# Patient Record
Sex: Male | Born: 1975 | Race: Black or African American | Hispanic: No | Marital: Married | Smoking: Never smoker
Health system: Southern US, Community
[De-identification: ages and names within clinical notes are randomized; demographics above are authoritative.]

---

## 2018-09-23 ENCOUNTER — Emergency Department (HOSPITAL_COMMUNITY): Payer: Federal, State, Local not specified - PPO

## 2018-09-23 ENCOUNTER — Encounter (HOSPITAL_COMMUNITY): Payer: Self-pay | Admitting: *Deleted

## 2018-09-23 ENCOUNTER — Other Ambulatory Visit: Payer: Self-pay

## 2018-09-23 ENCOUNTER — Observation Stay (HOSPITAL_COMMUNITY): Payer: Federal, State, Local not specified - PPO

## 2018-09-23 ENCOUNTER — Observation Stay (HOSPITAL_COMMUNITY)
Admission: EM | Admit: 2018-09-23 | Discharge: 2018-09-24 | Disposition: A | Discharge status: Home / Self Care | Payer: Federal, State, Local not specified - PPO | Attending: Internal Medicine | Admitting: Internal Medicine

## 2018-09-23 DIAGNOSIS — F4310 Post-traumatic stress disorder, unspecified: Secondary | ICD-10-CM | POA: Insufficient documentation

## 2018-09-23 DIAGNOSIS — R079 Chest pain, unspecified: Secondary | ICD-10-CM | POA: Insufficient documentation

## 2018-09-23 DIAGNOSIS — I119 Hypertensive heart disease without heart failure: Secondary | ICD-10-CM | POA: Insufficient documentation

## 2018-09-23 DIAGNOSIS — I429 Cardiomyopathy, unspecified: Secondary | ICD-10-CM | POA: Insufficient documentation

## 2018-09-23 DIAGNOSIS — I10 Essential (primary) hypertension: Secondary | ICD-10-CM | POA: Diagnosis not present

## 2018-09-23 DIAGNOSIS — Z79899 Other long term (current) drug therapy: Secondary | ICD-10-CM | POA: Insufficient documentation

## 2018-09-23 DIAGNOSIS — E860 Dehydration: Secondary | ICD-10-CM | POA: Diagnosis not present

## 2018-09-23 DIAGNOSIS — I959 Hypotension, unspecified: Secondary | ICD-10-CM | POA: Insufficient documentation

## 2018-09-23 DIAGNOSIS — T671XXA Heat syncope, initial encounter: Secondary | ICD-10-CM

## 2018-09-23 DIAGNOSIS — N179 Acute kidney failure, unspecified: Secondary | ICD-10-CM | POA: Diagnosis not present

## 2018-09-23 DIAGNOSIS — Z1159 Encounter for screening for other viral diseases: Secondary | ICD-10-CM | POA: Insufficient documentation

## 2018-09-23 DIAGNOSIS — I5032 Chronic diastolic (congestive) heart failure: Secondary | ICD-10-CM | POA: Diagnosis not present

## 2018-09-23 DIAGNOSIS — S8001XA Contusion of right knee, initial encounter: Secondary | ICD-10-CM

## 2018-09-23 DIAGNOSIS — T675XXA Heat exhaustion, unspecified, initial encounter: Secondary | ICD-10-CM

## 2018-09-23 LAB — URINALYSIS, ROUTINE W REFLEX MICROSCOPIC
Bilirubin Urine: NEGATIVE
Glucose, UA: NEGATIVE mg/dL
Hgb urine dipstick: NEGATIVE
Ketones, ur: NEGATIVE mg/dL
Leukocytes,Ua: NEGATIVE
Nitrite: NEGATIVE
Protein, ur: NEGATIVE mg/dL
Specific Gravity, Urine: 1.012 (ref 1.005–1.030)
pH: 5 (ref 5.0–8.0)

## 2018-09-23 LAB — BASIC METABOLIC PANEL
Anion gap: 14 (ref 5–15)
BUN: 18 mg/dL (ref 6–20)
CO2: 22 mmol/L (ref 22–32)
Calcium: 10.8 mg/dL — ABNORMAL HIGH (ref 8.9–10.3)
Chloride: 104 mmol/L (ref 98–111)
Creatinine, Ser: 2.49 mg/dL — ABNORMAL HIGH (ref 0.61–1.24)
GFR calc Af Amer: 35 mL/min — ABNORMAL LOW (ref >60)
GFR calc non Af Amer: 30 mL/min — ABNORMAL LOW (ref >60)
Glucose, Bld: 154 mg/dL — ABNORMAL HIGH (ref 70–99)
Potassium: 3.6 mmol/L (ref 3.5–5.1)
Sodium: 140 mmol/L (ref 135–145)

## 2018-09-23 LAB — CK: Total CK: 480 U/L — ABNORMAL HIGH (ref 49–397)

## 2018-09-23 LAB — CBC
HCT: 50.3 % (ref 39.0–52.0)
Hemoglobin: 16.9 g/dL (ref 13.0–17.0)
MCH: 29.2 pg (ref 26.0–34.0)
MCHC: 33.6 g/dL (ref 30.0–36.0)
MCV: 86.9 fL (ref 80.0–100.0)
Platelets: 317 10*3/uL (ref 150–400)
RBC: 5.79 MIL/uL (ref 4.22–5.81)
RDW: 13.1 % (ref 11.5–15.5)
WBC: 12.3 10*3/uL — ABNORMAL HIGH (ref 4.0–10.5)
nRBC: 0 % (ref 0.0–0.2)

## 2018-09-23 LAB — MAGNESIUM: Magnesium: 2.1 mg/dL (ref 1.7–2.4)

## 2018-09-23 LAB — CBG MONITORING, ED: Glucose-Capillary: 106 mg/dL — ABNORMAL HIGH (ref 70–99)

## 2018-09-23 LAB — TROPONIN I (HIGH SENSITIVITY)
Troponin I (High Sensitivity): 7 ng/L (ref <18)
Troponin I (High Sensitivity): 8 ng/L (ref <18)

## 2018-09-23 MED ORDER — MAGNESIUM SULFATE 2 GM/50ML IV SOLN
2.0000 g | Freq: Once | INTRAVENOUS | Status: AC
  Administered 2018-09-24: 2 g via INTRAVENOUS
  Filled 2018-09-23: qty 50

## 2018-09-23 MED ORDER — SODIUM CHLORIDE 0.9 % IV BOLUS
1000.0000 mL | Freq: Once | INTRAVENOUS | Status: AC
  Administered 2018-09-23: 1000 mL via INTRAVENOUS

## 2018-09-23 MED ORDER — SODIUM CHLORIDE 0.9 % IV SOLN
INTRAVENOUS | Status: DC
  Administered 2018-09-23 – 2018-09-24 (×3): via INTRAVENOUS

## 2018-09-23 MED ORDER — SODIUM CHLORIDE 0.9 % IV BOLUS: 1000.0000 mL | Freq: Once | INTRAVENOUS | Status: DC

## 2018-09-23 MED ORDER — SODIUM CHLORIDE 0.9% FLUSH: 3.0000 mL | Freq: Once | INTRAVENOUS | Status: DC

## 2018-09-23 MED ORDER — SODIUM CHLORIDE 0.9 % IV SOLN: INTRAVENOUS | Status: DC

## 2018-09-23 MED ORDER — ATORVASTATIN CALCIUM 10 MG PO TABS
20.0000 mg | ORAL_TABLET | Freq: Every day | ORAL | Status: DC
  Administered 2018-09-24: 20 mg via ORAL
  Filled 2018-09-23: qty 2

## 2018-09-23 MED ORDER — ACETAMINOPHEN 325 MG PO TABS: 650.0000 mg | ORAL_TABLET | Freq: Four times a day (QID) | ORAL | Status: DC | PRN

## 2018-09-23 MED ORDER — ACETAMINOPHEN 650 MG RE SUPP: 650.0000 mg | Freq: Four times a day (QID) | RECTAL | Status: DC | PRN

## 2018-09-23 NOTE — ED Provider Notes (Signed)
[facility] EMERGENCY [facility]PARTMENT Provider Note   CSN: [id number] Arrival date & time: 09/23/18  1227    History   Chief Complaint Chief Complaint  Patient presents with  . Chest Pain  . Near Syncope  . Knee Pain    HPI Tyler SomanJames N Zapanta Jr. is a 43 y.o. male.     Pt presents to the ED today with low blood pressure and syncope.  Pt is here visiting family and was working in his grandmother's yard.  It is very hot outside and he had not been drinking.  The pt said he felt weak and felt like he was going to pass out.  He went to sit down, but ended up falling forward on his right knee.  The pt did have some cp as well.  Pt denies f/c.  No sob.  EMS was called to the home and his initial bp was in the 70s.  He did not want to go by ambulance, so he was driven by a family member.  Initial bp here was in the 80s.  The pt has a hx of htn and took his bp meds before he worked outside.     History reviewed. No pertinent past medical history.  There are no active problems to display for this patient.   History reviewed. No pertinent surgical history.      Home Medications    Prior to Admission medications   Medication Sig Start Date End Date Taking? Authorizing Provider  amLODIPine-Valsartan-HCTZ (EXFORGE HCT) 10-320-25 MG TABS Take 1 tablet by mouth daily.   Yes [provider]  atorvastatin (LIPITOR) 20 MG tablet Take 20 mg by mouth daily.   Yes [provider]  metoprolol succinate (TOPROL-XL) 25 MG 24 hr tablet Take 25 mg by mouth daily.    Yes [provider]    Family History No family history on file.  Social History Social History   Tobacco Use  . Smoking status: Not on file  Substance Use Topics  . Alcohol use: Not on file  . Drug use: Not on file     Allergies   Patient has no known allergies.   Review of Systems Review of Systems  Cardiovascular: Positive for chest pain.  Musculoskeletal:       Right knee  pain  Neurological: Positive for syncope.  All other systems reviewed and are negative.    Physical Exam Updated Vital Signs BP (!) 92/49   Pulse 77   Temp 97.8 F (36.6 C) (Oral)   Resp 13   Ht 6\' 2"  (1.88 m)   Wt 131.5 kg   SpO2 99%   BMI 37.23 kg/m   Physical Exam Vitals signs and nursing note reviewed.  Constitutional:      General: He is in acute distress.     Appearance: He is well-developed. He is diaphoretic.  HENT:     Head: Normocephalic and atraumatic.  Eyes:     Extraocular Movements: Extraocular movements intact.     Pupils: Pupils are equal, round, and reactive to light.  Neck:     Musculoskeletal: Normal range of motion and neck supple.  Cardiovascular:     Rate and Rhythm: Normal rate and regular rhythm.     Heart sounds: Normal heart sounds.  Pulmonary:     Effort: Pulmonary effort is normal.     Breath sounds: Normal breath sounds.  Abdominal:     General: Bowel sounds are normal.  Palpations: Abdomen is soft.  Musculoskeletal:     Right knee: Tenderness found.  Skin:    General: Skin is warm.     Capillary Refill: Capillary refill takes less than 2 seconds.  Neurological:     General: No focal deficit present.     Mental Status: He is alert and oriented to person, place, and time.  Psychiatric:        Mood and Affect: Mood normal.        Behavior: Behavior normal.      ED Treatments / Results  Labs (all labs ordered are listed, but only abnormal results are displayed) Labs Reviewed  BASIC METABOLIC PANEL - Abnormal; Notable for the following components:      Result Value   Glucose, Bld 154 (*)    Creatinine, Ser 2.49 (*)    Calcium 10.8 (*)    GFR calc non Af Amer 30 (*)    GFR calc Af Amer 35 (*)    All other components within normal limits  CBC - Abnormal; Notable for the following components:   WBC 12.3 (*)    All other components within normal limits  CBG MONITORING, ED - Abnormal; Notable for the following components:    Glucose-Capillary 106 (*)    All other components within normal limits  NOVEL CORONAVIRUS, NAA (HOSPITAL ORDER, SEND-OUT TO REF LAB)  MAGNESIUM  URINALYSIS, ROUTINE W REFLEX MICROSCOPIC  CK  CBG MONITORING, ED  TROPONIN I (HIGH SENSITIVITY)  TROPONIN I (HIGH SENSITIVITY)    EKG EKG Interpretation  Date/Time:  Friday September 23 2018 12:36:11 EDT Ventricular Rate:  113 PR Interval:  124 QRS Duration: 82 QT Interval:  316 QTC Calculation: 433 R Axis:   74 Text Interpretation:  Sinus tachycardia Minimal voltage criteria for LVH, may be normal variant Abnormal QRS-T angle, consider primary T wave abnormality Abnormal ECG No old tracing to compare Confirmed by Jacalyn LefevreHaviland, Juelz Whittenberg [phone]) on 09/23/2018 12:52:01 PM Also confirmed by Jacalyn LefevreHaviland, Aliesha Dolata [phone]), editor Elita QuickWatlington, Beverly ([id number])  on 09/23/2018 2:13:47 PM   Radiology Dg Chest 2 View  Result Date: 09/23/2018 CLINICAL DATA:  Chest pain EXAM: CHEST - 2 VIEW COMPARISON:  None. FINDINGS: The heart size and mediastinal contours are within normal limits. Both lungs are clear. The visualized skeletal structures are unremarkable. IMPRESSION: No active cardiopulmonary disease. Electronically Signed   By: Sherian ReinWei-Chen  Lin M.D.   On: 09/23/2018 13:29   Dg Knee Complete 4 Views Right  Result Date: 09/23/2018 CLINICAL DATA:  Status post fall with right knee pain. EXAM: RIGHT KNEE - COMPLETE 4+ VIEW COMPARISON:  None. FINDINGS: No evidence of fracture, dislocation, or joint effusion. No evidence of arthropathy or other focal bone abnormality. Soft tissues are unremarkable. IMPRESSION: Negative. Electronically Signed   By: Sherian ReinWei-Chen  Lin M.D.   On: 09/23/2018 13:29    Procedures Procedures (including critical care time)  Medications Ordered in ED Medications  sodium chloride flush (NS) 0.9 % injection 3 mL (has no administration in time range)  sodium chloride 0.9 % bolus 1,000 mL (1,000 mLs Intravenous New Bag/Given 09/23/18 1335)    And  0.9 %   sodium chloride infusion (has no administration in time range)  sodium chloride 0.9 % bolus 1,000 mL (has no administration in time range)  sodium chloride 0.9 % bolus 1,000 mL (has no administration in time range)  sodium chloride 0.9 % bolus 1,000 mL (1,000 mLs Intravenous New Bag/Given 09/23/18 1431)     Initial Impression / Assessment and  Plan / ED Course  I have reviewed the triage vital signs and the nursing notes.  Pertinent labs & imaging results that were available during my care of the patient were reviewed by me and considered in my medical decision making (see chart for details).      Pt given IVFs and bp has improved.  EKG showed sinus tachy and initial troponin is negative.  The pt's syncope is likely from heat and sweating, but I added on a CT chest to check for aortic dissection.  He can't have contrast due to elevated cr.  He has never had kidney abnormalities in the past.  Knee xray nl.  CXR clear.  Pt's labs reviewed.    Pt's covid is pending.  Pt d/w Dr. Jamse Arn ([facility]) who will admit.  She requested CK (ordered) and additional IVFs.    CRITICAL CARE Performed by: Isla Pence   Total critical care time: 30 minutes  Critical care time was exclusive of separately billable procedures and treating other patients.  Critical care was necessary to treat or prevent imminent or life-threatening deterioration.  Critical care was time spent personally by me on the following activities: development of treatment plan with patient and/or surrogate as well as nursing, discussions with consultants, evaluation of patient's response to treatment, examination of patient, obtaining history from patient or surrogate, ordering and performing treatments and interventions, ordering and review of laboratory studies, ordering and review of radiographic studies, pulse oximetry and re-evaluation of patient's condition.  Tyler Roy. was evaluated in Emergency Department on  09/23/2018 for the symptoms described in the history of present illness. He was evaluated in the context of the global COVID-19 pandemic, which necessitated consideration that the patient might be at risk for infection with the SARS-CoV-2 virus that causes COVID-19. Institutional protocols and algorithms that pertain to the evaluation of patients at risk for COVID-19 are in a state of rapid change based on information released by regulatory bodies including the CDC and federal and state organizations. These policies and algorithms were followed during the patient's care in the ED.  Final Clinical Impressions(s) / ED Diagnoses   Final diagnoses:  Heat exhaustion, initial encounter  AKI (acute kidney injury) ([place])  Heat syncope, initial encounter  Contusion of right knee, initial encounter    ED Discharge Orders    None       Isla Pence, MD 09/23/18 [phone]

## 2018-09-23 NOTE — ED Triage Notes (Addendum)
To ED for eval of syncopal episode, cp, and right knee pain. Pt states he was working in his mom's yard when he became very weak, began feel tightness in chest, neck pain, and left arm pain - pt then dropped to knees and passed out. Multiple episodes of vomiting. EMS came to scene - did ecg. Pt wanted to come pov. Told his bp was 70/p via ems. No pain in chest now - complains of right knee pain. Skin w/d, resp e/u. Pt states he has never felt this before.   At end of triage - pt became nauseated and began belching. States he felt very dizzy and weak. Asking for water. Pt moved to a room in ED.

## 2018-09-23 NOTE — ED Notes (Signed)
Attempted to complete orthostatic VS but unable to do so due to hypotension. RN aware.

## 2018-09-23 NOTE — Progress Notes (Signed)
NEW ADMISSION NOTE New Admission Note:   Arrival Method: stretcher from ED  Mental Orientation: axox4 Telemetry: box7 Assessment: Completed Skin: see assessment  PF:[id number] AC  Pain: denies Tubes: none  Safety Measures: Safety Fall Prevention Plan has been discussed  Admission: to be completed  [facility] [place] Orientation: Patient has been orientated to the room, unit and staff.  Family: none   Orders have been reviewed and implemented. Will continue to monitor the patient. Call light has been placed within reach and bed alarm has been activated.   Paulla Fore, RN

## 2018-09-23 NOTE — H&P (Signed)
History and Physical:    Tyler SomanJames N Trammel Jr.   ZOX:[id number] DOB: [birth year] DOA: 09/23/2018  Referring MD/provider: Dr. Particia NearingHaviland PCP: Patient, No Pcp Per   Patient coming from: Home  Chief Complaint: Weakness dizziness backache  History of Present Illness:   Tyler SomanJames N Bonifield Jr. is an 43 y.o. male with past medical history significant for hypertension since age 89, grade 2 diastolic dysfunction and PTSD who was in his usual state of good health until earlier today when he developed profound weakness and fatigue while working in the yard doing physical labor.  Patient states that about an hour and a half after working in the sun patient developed cramping in his neck his back his left chest with radiation down his left arm.  Patient states that he went and got some water and then vomited and after the vomitus his chest pain resolved although he continued to have back and neck pain.  Continues to work for another hour and a half in the sun felt dizzy and felt like he could not walk properly.  He subsequently started vomiting and then had an episode of syncope.  Patient states that when he came to the EMS was around him and was telling him that his blood pressure was 75/35 and they recommended transfer to the hospital.  Patient states that he preferred for his father to drive him and he [facility] by private [facility] where his initial blood pressure was noted to be in the mid 80s over 40s.  At present patient states he continues to have neck and back cramping although much improved since he started getting fluids.  He no longer has any chest discomfort or arm pain.  Patient does admit to feeling very weak and tired compared to how he normally feels.  He no longer feels nauseated.  He states he is hungry.  No abdominal pain.  Patient denies previous history of kidney dysfunction.  He does have a history of diastolic dysfunction as noted above.  He notes he had a stress test in 2018 when he had  left-sided chest pain with radiation to his left arm however he believes that was negative.  He denies any history of heart attack and he states he is sure that he has diastolic dysfunction not systolic dysfunction because his wife who is a nurse checked it with his PCP today.  History of pulmonary edema in the past.  ED Course:  The patient was treated with aggressive IV fluid resuscitation with much improvement in his blood pressure.  Patient also stated that he started to feel much much better after getting fluids.  Laboratory work-up is notable for acute kidney injury with a creatinine of 2.5.  Of note patient does not know what his usual kidney function is.  Patient states that he did take his antihypertensives this morning before going out to work.  ROS:   ROS   Review of Systems: General: No fever, chills, weight changes Skin: No rashes, lesions, wounds Eyes: no discharge, redness, pain HENT: no ear pain, hearing loss, drainage, tinnitus Endocrine: no heat/cold intolerance, no polyuria Respiratory: No cough,, shortness of breath, hemoptysis GI: No nausea, vomiting, diarrhea, constipation GU: No dysuria, increased frequency   Past Medical History:   History reviewed. No pertinent past medical history.  Past Surgical History:   History reviewed. No pertinent surgical history.  Social History:   Social History   Socioeconomic History  . Marital status: Married  Spouse name: Not on file  . Number of children: Not on file  . Years of education: Not on file  . Highest education level: Not on file  Occupational History  . Not on file  Social Needs  . Financial resource strain: Not on file  . Food insecurity    Worry: Not on file    Inability: Not on file  . Transportation needs    Medical: Not on file    Non-medical: Not on file  Tobacco Use  . Smoking status: Not on file  Substance and Sexual Activity  . Alcohol use: Not on file  . Drug use: Not on file  .  Sexual activity: Not on file  Lifestyle  . Physical activity    Days per week: Not on file    Minutes per session: Not on file  . Stress: Not on file  Relationships  . Social Musicianconnections    Talks on phone: Not on file    Gets together: Not on file    Attends religious service: Not on file    Active member of club or organization: Not on file    Attends meetings of clubs or organizations: Not on file    Relationship status: Not on file  . Intimate partner violence    Fear of current or ex partner: Not on file    Emotionally abused: Not on file    Physically abused: Not on file    Forced sexual activity: Not on file  Other Topics Concern  . Not on file  Social History Narrative  . Not on file    Allergies   Patient has no known allergies.  Family history:   No family history on file.  Current Medications:   Prior to Admission medications   Medication Sig Start Date End Date Taking? Authorizing Provider  amLODIPine-Valsartan-HCTZ (EXFORGE HCT) 10-320-25 MG TABS Take 1 tablet by mouth daily.   Yes [provider]  atorvastatin (LIPITOR) 20 MG tablet Take 20 mg by mouth daily.   Yes [provider]  metoprolol succinate (TOPROL-XL) 25 MG 24 hr tablet Take 25 mg by mouth daily.    Yes [provider]    Physical Exam:   Vitals:   09/23/18 1500 09/23/18 1515 09/23/18 1531 09/23/18 1535  BP: (!) 106/53 (!) 109/53 102/68 (!) 92/49  Pulse: 79 77 73 77  Resp: 16 (!) 24 14 13   Temp:      TempSrc:      SpO2: 100% 100% 100% 99%  Weight:      Height:         Physical Exam: Blood pressure (!) 92/49, pulse 77, temperature 97.8 F (36.6 C), temperature source Oral, resp. rate 13, height 6\' 2"  (1.88 m), weight 131.5 kg, SpO2 99 %. Gen: Heavy but well-appearing man lying flat in bed in no acute distress talking on the phone with his wife. Eyes: Sclerae anicteric.  Chest: Moderately good air entry bilaterally with no adventitious sounds.  CV:  Distant, regular, 2/6 systolic murmur left sternal border without radiation  Abdomen: Obese, NABS, soft, nondistended, nontender.  Extremities: No edema.  Skin: Warm and dry. No rashes, lesions or wounds. Neuro: Alert and oriented times 3; grossly nonfocal. Psych: Patient is cooperative, logical and coherent with somewhat anxious mood and affect.  Data Review:    Labs: Basic Metabolic Panel: Recent Labs  Lab 09/23/18 1245  NA 140  K 3.6  CL 104  CO2 22  GLUCOSE 154*  BUN 18  CREATININE 2.49*  CALCIUM 10.8*  MG 2.1   Liver Function Tests: No results for input(s): AST, ALT, ALKPHOS, BILITOT, PROT, ALBUMIN in the last 168 hours. No results for input(s): LIPASE, AMYLASE in the last 168 hours. No results for input(s): AMMONIA in the last 168 hours. CBC: Recent Labs  Lab 09/23/18 1245  WBC 12.3*  HGB 16.9  HCT 50.3  MCV 86.9  PLT 317   Cardiac Enzymes: No results for input(s): CKTOTAL, CKMB, CKMBINDEX, TROPONINI in the last 168 hours.  BNP (last 3 results) No results for input(s): PROBNP in the last 8760 hours. CBG: Recent Labs  Lab 09/23/18 1401  GLUCAP 106*    Urinalysis No results found for: COLORURINE, APPEARANCEUR, LABSPEC, PHURINE, GLUCOSEU, HGBUR, BILIRUBINUR, KETONESUR, PROTEINUR, UROBILINOGEN, NITRITE, LEUKOCYTESUR    Radiographic Studies: Dg Chest 2 View  Result Date: 09/23/2018 CLINICAL DATA:  Chest pain EXAM: CHEST - 2 VIEW COMPARISON:  None. FINDINGS: The heart size and mediastinal contours are within normal limits. Both lungs are clear. The visualized skeletal structures are unremarkable. IMPRESSION: No active cardiopulmonary disease. Electronically Signed   By: Abelardo Diesel M.D.   On: 09/23/2018 13:29   Dg Knee Complete 4 Views Right  Result Date: 09/23/2018 CLINICAL DATA:  Status post fall with right knee pain. EXAM: RIGHT KNEE - COMPLETE 4+ VIEW COMPARISON:  None. FINDINGS: No evidence of fracture, dislocation, or joint effusion. No evidence  of arthropathy or other focal bone abnormality. Soft tissues are unremarkable. IMPRESSION: Negative. Electronically Signed   By: Abelardo Diesel M.D.   On: 09/23/2018 13:29    EKG: Independently reviewed.  Sinus tachycardia at 113.  P pulmonale.  Normal intervals.  Normal axis.  LVH by voltage criteria.  Assessment/Plan:   Active Problems:   AKI (acute kidney injury) ([place])   Essential hypertension   Cardiomyopathy ([place])  43 year old man with hypertension, grade 2 diastolic dysfunction presents with syncope, hypotension and acute kidney injury after doing yard work in the full sun for 4 hours earlier today.    AKI Very likely secondary to hypotension in the setting of valsartan use. Patient CPK is only 500 so rhabdomyolysis not a concern. We will continue aggressive fluid resuscitation already started in the ED. We will hold amlodipine/valsartan/HCTZ as well as Toprol-XL until patient is adequately fluid resuscitated  CHEST PAIN Patient's chest pain is very suggestive of possible ischemia however his EKGs without any ST-T wave changes and his high-sensitivity troponin is negative.  Of note patient had had similar symptoms 2 years ago with a negative stress test shortly thereafter.  HTN Hold antihypertensives until adequately fluid resuscitated    Other information:   DVT prophylaxis: Early ambulation Code Status: Full code. Family Communication: Spoke with patient's wife Fransisca Kaufmann Disposition Plan: Home Consults called: None Admission status: Observation    [place]  [facility]  If 7PM-7AM, please contact night-coverage [email or web address] Password [email or web address] 09/23/2018, 3:59 PM

## 2018-09-23 NOTE — ED Notes (Signed)
[place] (Father): [phone] Pt's father would like an update when possible.

## 2018-09-24 ENCOUNTER — Encounter (HOSPITAL_COMMUNITY): Payer: Self-pay | Admitting: *Deleted

## 2018-09-24 ENCOUNTER — Observation Stay (HOSPITAL_BASED_OUTPATIENT_CLINIC_OR_DEPARTMENT_OTHER): Payer: Federal, State, Local not specified - PPO

## 2018-09-24 DIAGNOSIS — R079 Chest pain, unspecified: Secondary | ICD-10-CM | POA: Diagnosis not present

## 2018-09-24 DIAGNOSIS — E860 Dehydration: Secondary | ICD-10-CM | POA: Diagnosis not present

## 2018-09-24 DIAGNOSIS — N179 Acute kidney failure, unspecified: Secondary | ICD-10-CM

## 2018-09-24 DIAGNOSIS — I361 Nonrheumatic tricuspid (valve) insufficiency: Secondary | ICD-10-CM | POA: Diagnosis not present

## 2018-09-24 DIAGNOSIS — I959 Hypotension, unspecified: Secondary | ICD-10-CM

## 2018-09-24 DIAGNOSIS — I10 Essential (primary) hypertension: Secondary | ICD-10-CM

## 2018-09-24 LAB — BASIC METABOLIC PANEL
Anion gap: 10 (ref 5–15)
BUN: 20 mg/dL (ref 6–20)
CO2: 23 mmol/L (ref 22–32)
Calcium: 9 mg/dL (ref 8.9–10.3)
Chloride: 106 mmol/L (ref 98–111)
Creatinine, Ser: 1.42 mg/dL — ABNORMAL HIGH (ref 0.61–1.24)
GFR calc Af Amer: >60 mL/min (ref >60)
GFR calc non Af Amer: 60 mL/min — ABNORMAL LOW (ref >60)
Glucose, Bld: 122 mg/dL — ABNORMAL HIGH (ref 70–99)
Potassium: 3.6 mmol/L (ref 3.5–5.1)
Sodium: 139 mmol/L (ref 135–145)

## 2018-09-24 LAB — CBC
HCT: 43.7 % (ref 39.0–52.0)
Hemoglobin: 14.5 g/dL (ref 13.0–17.0)
MCH: 29.2 pg (ref 26.0–34.0)
MCHC: 33.2 g/dL (ref 30.0–36.0)
MCV: 88.1 fL (ref 80.0–100.0)
Platelets: 238 10*3/uL (ref 150–400)
RBC: 4.96 MIL/uL (ref 4.22–5.81)
RDW: 13.2 % (ref 11.5–15.5)
WBC: 10 10*3/uL (ref 4.0–10.5)
nRBC: 0 % (ref 0.0–0.2)

## 2018-09-24 LAB — NOVEL CORONAVIRUS, NAA (HOSP ORDER, SEND-OUT TO REF LAB; TAT 18-24 HRS): SARS-CoV-2, NAA: NOT DETECTED

## 2018-09-24 LAB — HIV ANTIBODY (ROUTINE TESTING W REFLEX): HIV Screen 4th Generation wRfx: NONREACTIVE

## 2018-09-24 LAB — ECHOCARDIOGRAM COMPLETE
Height: 74 in
Weight: 4627.9 oz

## 2018-09-24 LAB — MAGNESIUM: Magnesium: 2.6 mg/dL — ABNORMAL HIGH (ref 1.7–2.4)

## 2018-09-24 MED ORDER — POTASSIUM CHLORIDE CRYS ER 20 MEQ PO TBCR
40.0000 meq | EXTENDED_RELEASE_TABLET | Freq: Once | ORAL | Status: AC
  Administered 2018-09-24: 40 meq via ORAL
  Filled 2018-09-24: qty 2

## 2018-09-24 MED ORDER — METOPROLOL SUCCINATE ER 25 MG PO TB24
25.0000 mg | ORAL_TABLET | Freq: Every day | ORAL | Status: DC
  Administered 2018-09-24: 25 mg via ORAL
  Filled 2018-09-24: qty 1

## 2018-09-24 MED ORDER — AMLODIPINE-VALSARTAN-HCTZ 10-320-25 MG PO TABS: 1.0000 | ORAL_TABLET | Freq: Every day | ORAL | 0 refills | Status: AC

## 2018-09-24 NOTE — Progress Notes (Signed)
DISCHARGE NOTE  Tyler Roy. to be discharged Home per MD order. Patient verbalized understanding.  Skin clean, dry and intact without evidence of skin break down, no evidence of skin tears noted. IV catheter discontinued intact. Site without signs and symptoms of complications. Dressing and pressure applied. Pt denies pain at the site currently. No complaints noted.  Patient free of lines, drains, and wounds.   Discharge packet assembled. An After Visit Summary (AVS) was printed and given to the patient. Patient escorted via wheelchair and discharged to home via private auto.    Babs Sciara, RN

## 2018-09-24 NOTE — Discharge Summary (Signed)
Physician Discharge Summary  Myrtie SomanJames N Gafford Jr. ZOX:[id number] DOB: 04/09/1975 DOA: 09/23/2018  PCP: Patient, No Pcp Per  Admit date: 09/23/2018 Discharge date: 09/24/2018  Time spent: 60 minutes  Recommendations for Outpatient Follow-up:  1. Follow-up with PCP as scheduled on 09/26/2018.  On follow-up patient will need a basic metabolic profile done to follow-up on electrolytes and renal function.  Patient Exforge has been held and will defer resumption of Exforge to PCP.  Patient's blood pressure also need to be reassessed on follow-up.   Discharge Diagnoses:  Principal Problem:   AKI (acute kidney injury) (HCC) Active Problems:   Hypotension   Chest pain   Essential hypertension   Cardiomyopathy (HCC)   Dehydration   Discharge Condition: Stable and improved.  Diet recommendation: Heart healthy  Filed Weights   09/23/18 1339 09/23/18 1717 09/23/18 2210  Weight: 131.5 kg 128 kg 131.2 kg    History of present illness:  HPI per Dr. Ranell Patrickhatterjee Aswad N Padgett Jr. is an 43 y.o. male with past medical history significant for hypertension since age 89, grade 2 diastolic dysfunction and PTSD who was in his usual state of good health until earlier today when he developed profound weakness and fatigue while working in the yard doing physical labor.  Patient states that about an hour and a half after working in the sun patient developed cramping in his neck his back his left chest with radiation down his left arm.  Patient states that he went and got some water and then vomited and after the vomitus his chest pain resolved although he continued to have back and neck pain.  Continues to work for another hour and a half in the sun felt dizzy and felt like he could not walk properly.  He subsequently started vomiting and then had an episode of syncope.  Patient states that when he came to the EMS was around him and was telling him that his blood pressure was 75/35 and they recommended transfer to the  hospital.  Patient states that he preferred for his father to drive him and he [facility] by private [facility] where his initial blood pressure was noted to be in the mid 80s over 40s.  At present patient states he continues to have neck and back cramping although much improved since he started getting fluids.  He no longer has any chest discomfort or arm pain.  Patient does admit to feeling very weak and tired compared to how he normally feels.  He no longer feels nauseated.  He states he is hungry.  No abdominal pain.  Patient denies previous history of kidney dysfunction.  He does have a history of diastolic dysfunction as noted above.  He notes he had a stress test in 2018 when he had left-sided chest pain with radiation to his left arm however he believes that was negative.  He denies any history of heart attack and he states he is sure that he has diastolic dysfunction not systolic dysfunction because his wife who is a nurse checked it with his PCP today.  History of pulmonary edema in the past.  ED Course:  The patient was treated with aggressive IV fluid resuscitation with much improvement in his blood pressure.  Patient also stated that he started to feel much much better after getting fluids.  Laboratory work-up is notable for acute kidney injury with a creatinine of 2.5.  Of note patient does not know what his usual kidney function is.  Patient  states that he did take his antihypertensives this morning before going out to work.  Hospital Course:  1 acute kidney injury/acute renal failure Felt secondary to prerenal azotemia.  Patient had presented with upper back and chest pain and noted to be hypotensive on admission in the setting of ARB and diuretic.  Patient noted to have a creatinine of 2.5 on admission.  Urinalysis which was done was nitrite negative, leukocytes negative, negative for protein.  Patient was hydrated aggressively with IV fluids with good urine output.  Renal function  improved and trended down to 1.42 by day of discharge.  Patient's ARB and diuretic and Norvasc were held.  Patient improved clinically.  Patient is from out of town and anxious to be discharged home.  As patient had improved clinically he will be discharged home in stable and improved condition.  Patient will be discharged off of his ARB and diuretic and Norvasc and not to resume until follow-up with PCP.  Patient states has an appointment to follow-up with PCP on 09/26/2018.  Patient be discharged in stable and improved condition.  2.  Hypovolemic hypotension Patient was noted to have been working out in the sun got very weak and tired with neck and back pain which was cramping.  Patient was noted to be hypotensive on presentation with systolics in the 80s.  Patient's antihypertensive medications were held.  Patient had no signs or symptoms of infection.  Cardiac enzymes were cycled which were flattened and negative.  EKG had no ischemic changes.  Patient had no overt bleeding.  Patient's blood pressure responded to IV fluids.  Hypotension had resolved by day of discharge.  3.  Chest pain Patient had presented with some chest pain and left upper extremity pain.  Likely musculoskeletal in nature.  Patient has been working out in the yard in the heat and onset of symptoms was back pain with cramping as well as left upper extremity pain and subsequently chest pain.  Patient hydrated with IV fluids.  Cardiac enzymes were cycled which were negative.  EKG with no ischemic changes noted.  Total CK was 480.  2D echo which was obtained with a with a EF of 50 to 55%, no wall motion abnormalities.  Patient had no further chest pain during the hospitalization and chest pain had resolved.  Patient was hydrated with IV fluids.  Patient's Toprol was initially held however resumed on day of discharge.  Outpatient follow-up with PCP.  4.  Hypertension Patient's antihypertensive medications were initially held on admission  due to hypotension.  As blood pressure improved with fluids patient subsequently placed back on home regimen of Toprol-XL.  Patient's Exforge will be held until follow-up with PCP on 09/26/2018 as patient had presented with acute renal failure.  Patient will need follow-up labs prior to resumption of Exforge which will defer to PCP.  5.  Dehydration Hydrated with IV fluids.  Euvolemic by day of discharge.  Procedures:  2D echo 09/24/2018  Chest x-ray 09/23/2018  Plain films of the right knee 09/23/2018  Consultations:  None  Discharge Exam: Vitals:   09/24/18 0553 09/24/18 0932  BP: 111/66 136/83  Pulse: 68 78  Resp: 16 18  Temp: 97.9 F (36.6 C) 97.9 F (36.6 C)  SpO2: 100% 96%    General: NAD Cardiovascular: RRR Respiratory: CTAB  Discharge Instructions   Discharge Instructions    Diet - low sodium heart healthy   Complete by: As directed    Increase activity slowly  Complete by: As directed      Allergies as of 09/24/2018   No Known Allergies     Medication List    TAKE these medications   amLODIPine-Valsartan-HCTZ 10-320-25 MG Tabs Commonly known as: Exforge HCT Take 1 tablet by mouth daily. May resume after follow-up with PCP. Start taking on: September 27, 2018 What changed:   additional instructions  These instructions start on September 27, 2018. If you are unsure what to do until then, ask your doctor or other care provider.   atorvastatin 20 MG tablet Commonly known as: LIPITOR Take 20 mg by mouth daily.   metoprolol succinate 25 MG 24 hr tablet Commonly known as: TOPROL-XL Take 25 mg by mouth daily.      No Known Allergies Follow-up Information    PCP. Schedule an appointment as soon as possible for a visit.   Why: Follow-up as scheduled on Monday, 09/26/2018           The results of significant diagnostics from this hospitalization (including imaging, microbiology, ancillary and laboratory) are listed below for reference.    Significant  Diagnostic Studies: Dg Chest 2 View  Result Date: 09/23/2018 CLINICAL DATA:  Chest pain EXAM: CHEST - 2 VIEW COMPARISON:  None. FINDINGS: The heart size and mediastinal contours are within normal limits. Both lungs are clear. The visualized skeletal structures are unremarkable. IMPRESSION: No active cardiopulmonary disease. Electronically Signed   By: Sherian ReinWei-Chen  Lin M.D.   On: 09/23/2018 13:29   Ct Chest Wo Contrast  Result Date: 09/23/2018 CLINICAL DATA:  Chest pain, hypertension, syncope. EXAM: CT CHEST WITHOUT CONTRAST TECHNIQUE: Multidetector CT imaging of the chest was performed following the standard protocol without IV contrast. COMPARISON:  Chest radiograph 09/23/2018 FINDINGS: Cardiovascular: Coronary artery calcifications are present. Heart is normal size. No pericardial effusion. Aorta is unremarkable the noncontrast technique. Mediastinum/Nodes: No enlarged mediastinal or axillary lymph nodes. Thyroid gland, trachea, and esophagus demonstrate no significant findings. Lungs/Pleura: Lungs are clear. No pleural effusion or pneumothorax. Upper Abdomen: No acute abnormality. Musculoskeletal: No chest wall mass or suspicious bone lesions identified. Minimal degenerative changes in the spine. Mild bilateral gynecomastia. IMPRESSION: 1. No acute findings in the chest. 2. Coronary artery disease. 3. Mild bilateral gynecomastia. Electronically Signed   By: MD Kreg ShropshirePrice  DeHay   On: 09/23/2018 16:31   Dg Knee Complete 4 Views Right  Result Date: 09/23/2018 CLINICAL DATA:  Status post fall with right knee pain. EXAM: RIGHT KNEE - COMPLETE 4+ VIEW COMPARISON:  None. FINDINGS: No evidence of fracture, dislocation, or joint effusion. No evidence of arthropathy or other focal bone abnormality. Soft tissues are unremarkable. IMPRESSION: Negative. Electronically Signed   By: Sherian ReinWei-Chen  Lin M.D.   On: 09/23/2018 13:29    Microbiology: No results found for this or any previous visit (from the past 240 hour(s)).    Labs: Basic Metabolic Panel: Recent Labs  Lab 09/23/18 1245 09/24/18 0453  NA 140 139  K 3.6 3.6  CL 104 106  CO2 22 23  GLUCOSE 154* 122*  BUN 18 20  CREATININE 2.49* 1.42*  CALCIUM 10.8* 9.0  MG 2.1 2.6*   Liver Function Tests: No results for input(s): AST, ALT, ALKPHOS, BILITOT, PROT, ALBUMIN in the last 168 hours. No results for input(s): LIPASE, AMYLASE in the last 168 hours. No results for input(s): AMMONIA in the last 168 hours. CBC: Recent Labs  Lab 09/23/18 1245 09/24/18 0453  WBC 12.3* 10.0  HGB 16.9 14.5  HCT 50.3 43.7  MCV  86.9 88.1  PLT 317 238   Cardiac Enzymes: Recent Labs  Lab 09/23/18 1245  CKTOTAL 480*   BNP: BNP (last 3 results) No results for input(s): BNP in the last 8760 hours.  ProBNP (last 3 results) No results for input(s): PROBNP in the last 8760 hours.  CBG: Recent Labs  Lab 09/23/18 1401  GLUCAP 106*       Signed:  Irine Seal MD.  [facility] 09/24/2018, 3:03 PM

## 2018-09-28 ENCOUNTER — Telehealth (HOSPITAL_COMMUNITY): Payer: Self-pay

## 2020-11-18 IMAGING — DX RIGHT KNEE - COMPLETE 4+ VIEW
4 series · 4 of 4 positions shown · non-contrast
Comparison: None.

CLINICAL DATA: Status post fall with right knee pain.

EXAM:
RIGHT KNEE - COMPLETE 4+ VIEW

[t knee ap right]
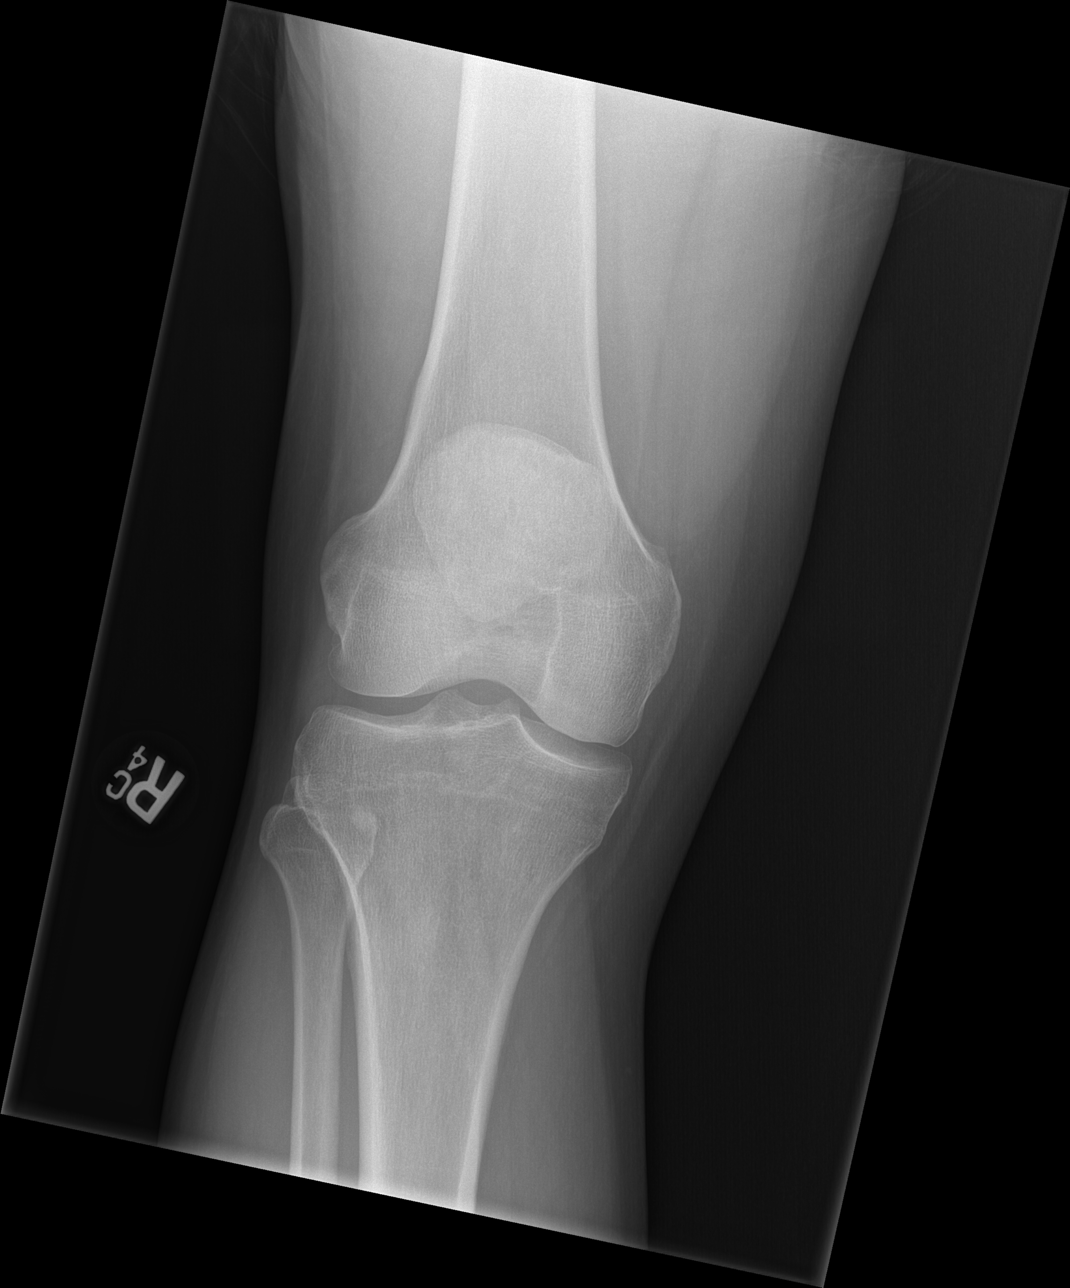

[t knee obl right]
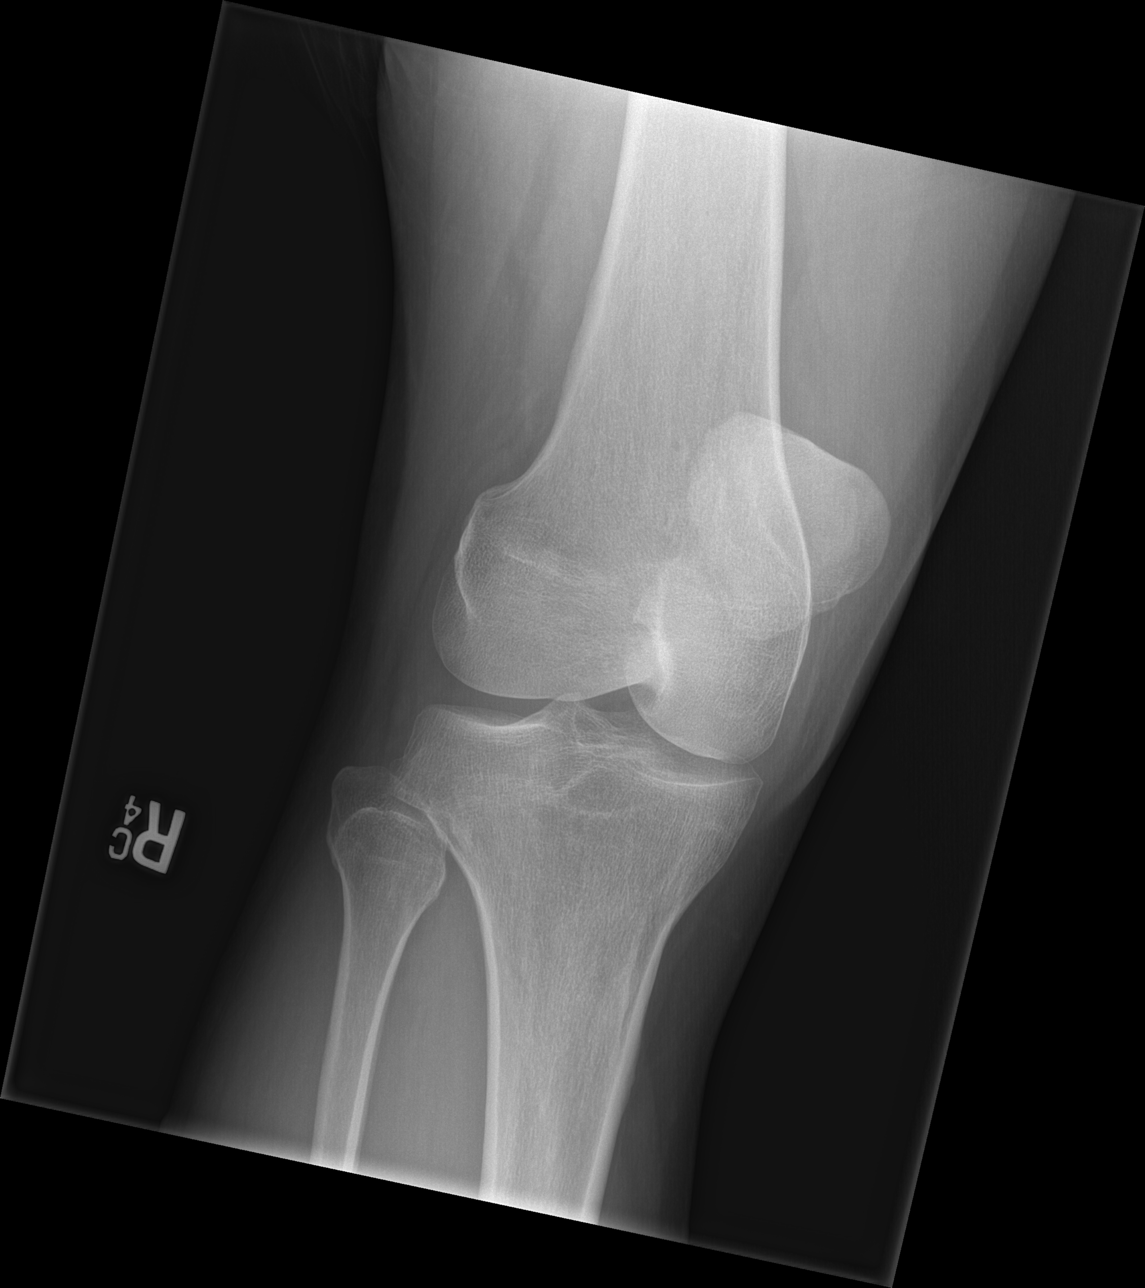

[t knee lat right (1 of 2)]
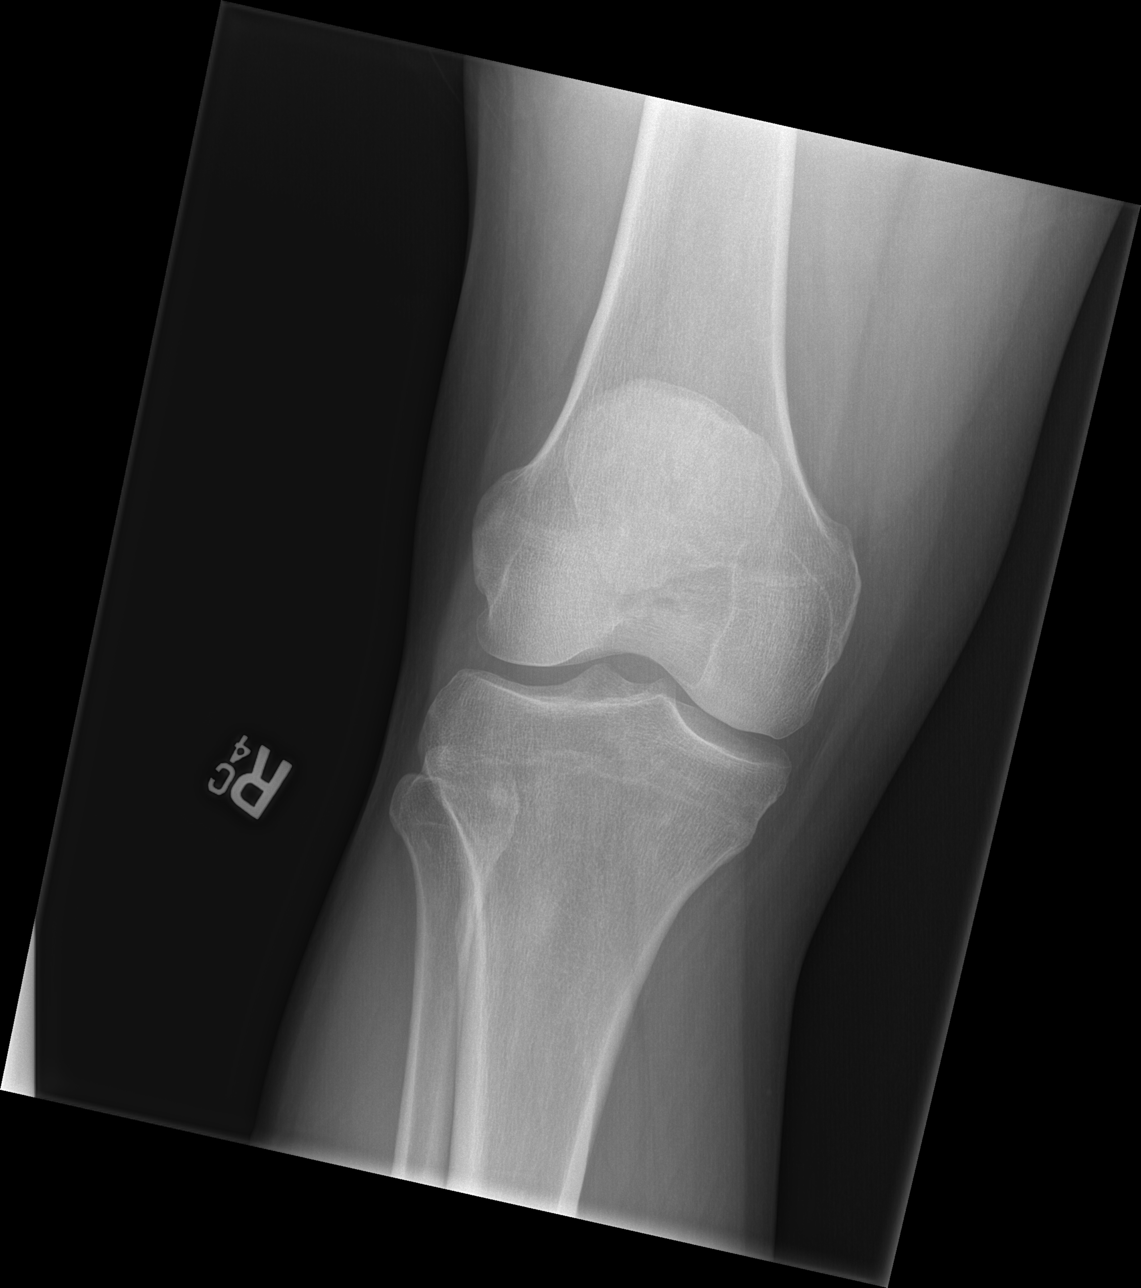

[t knee lat right (2 of 2)]
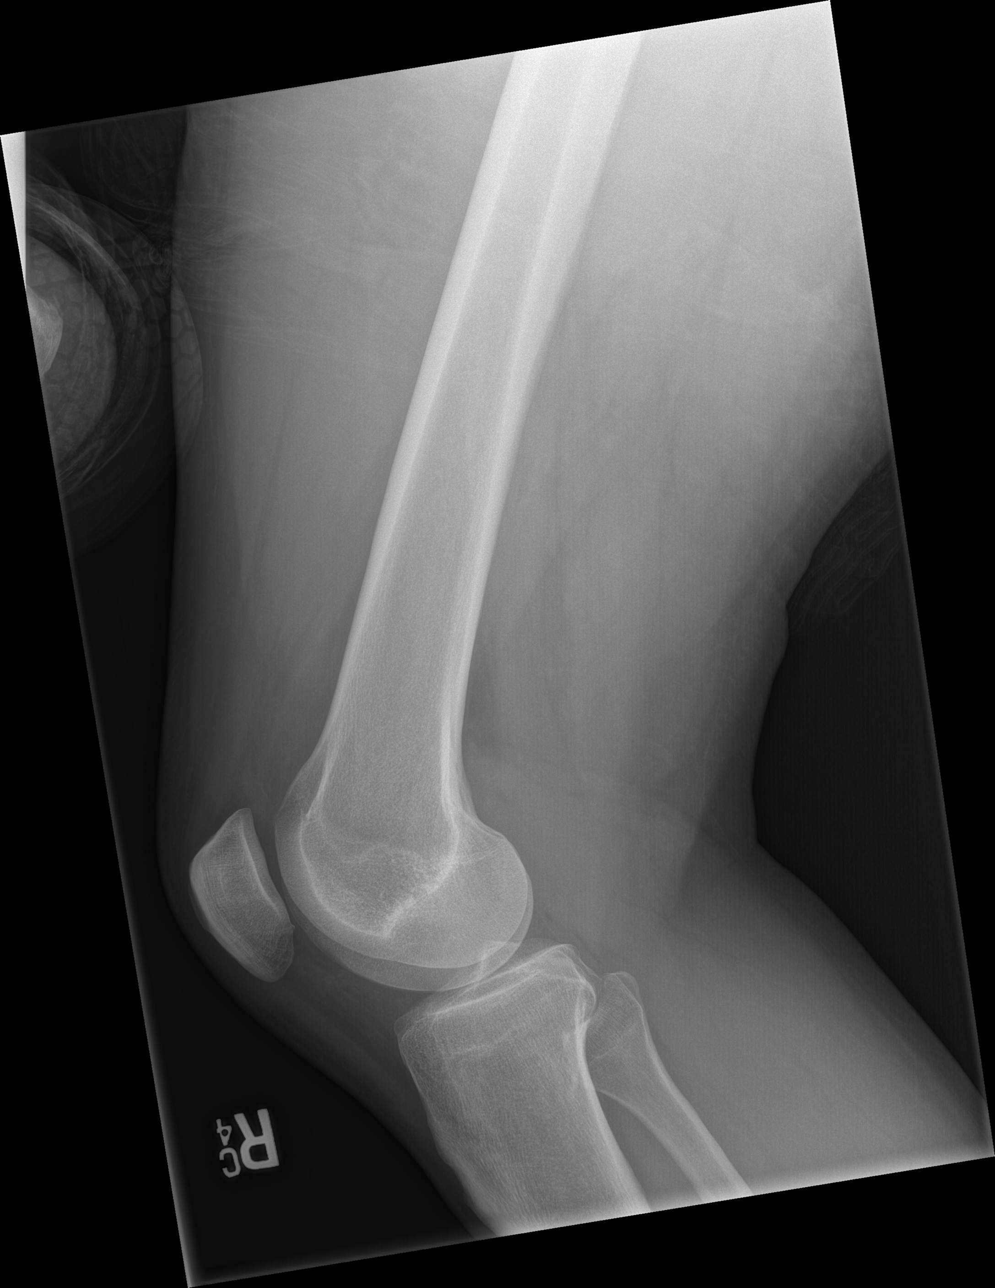

[4 of 4 positions shown; findings below may reference images not displayed]

FINDINGS: No evidence of fracture, dislocation, or joint effusion. No evidence
of arthropathy or other focal bone abnormality. Soft tissues are
unremarkable.
IMPRESSION: Negative.
